# Patient Record
Sex: Male | Born: 1955 | Race: White | Hispanic: No | Marital: Married | Smoking: Former smoker
Health system: Southern US, Community
[De-identification: ages and names within clinical notes are randomized; demographics above are authoritative.]

## PROBLEM LIST (undated history)

## (undated) DIAGNOSIS — I2510 Atherosclerotic heart disease of native coronary artery without angina pectoris: Secondary | ICD-10-CM

## (undated) DIAGNOSIS — I10 Essential (primary) hypertension: Secondary | ICD-10-CM

## (undated) DIAGNOSIS — M1990 Unspecified osteoarthritis, unspecified site: Secondary | ICD-10-CM

## (undated) HISTORY — PX: MANDIBLE RECONSTRUCTION: SHX431

## (undated) HISTORY — PX: COLONOSCOPY W/ POLYPECTOMY: SHX1380

---

## 2004-01-19 DIAGNOSIS — I2510 Atherosclerotic heart disease of native coronary artery without angina pectoris: Secondary | ICD-10-CM

## 2004-01-19 HISTORY — DX: Atherosclerotic heart disease of native coronary artery without angina pectoris: I25.10

## 2005-02-25 HISTORY — PX: CORONARY ANGIOPLASTY: SHX604

## 2014-01-18 HISTORY — PX: COLONOSCOPY: SHX5424

## 2014-11-20 ENCOUNTER — Other Ambulatory Visit (HOSPITAL_COMMUNITY): Payer: Self-pay | Admitting: Neurosurgery

## 2014-11-22 ENCOUNTER — Encounter (HOSPITAL_COMMUNITY): Payer: Self-pay | Admitting: *Deleted

## 2014-11-22 NOTE — Progress Notes (Addendum)
Mr Jordan Freeman reports a history of cardiac stents being place in 2006. Denies chest pain or shortness of breat.  Cardiologist is with [facility] in [place] , [place]; I have requested records for there.  Patient states that he has had an EKG this year, "sees cardiologist every year."  PCP is Dr Thornton DalesAlan Freeman, [place] , [place]; I requested labs and last office notes from PCP's office.  I called and left a voice message on Jordan Freeman's voice mail that patient reported that he was not instructed about continuing or stopping Aspirin 81.  I asked Jordan Freeman to call patient.

## 2014-11-24 MED ORDER — CEFAZOLIN SODIUM-DEXTROSE 2-3 GM-% IV SOLR
2.0000 g | INTRAVENOUS | Status: AC
  Administered 2014-11-25: 2 g via INTRAVENOUS
  Filled 2014-11-24: qty 50

## 2014-11-25 ENCOUNTER — Inpatient Hospital Stay (HOSPITAL_COMMUNITY): Payer: 59

## 2014-11-25 ENCOUNTER — Encounter (HOSPITAL_COMMUNITY): Payer: Self-pay | Admitting: General Practice

## 2014-11-25 ENCOUNTER — Inpatient Hospital Stay (HOSPITAL_COMMUNITY)
Admission: RE | Admit: 2014-11-25 | Discharge: 2014-11-26 | DRG: 473 | Disposition: A | Discharge status: Home / Self Care | Payer: 59 | Source: Ambulatory Visit | Attending: Neurosurgery | Admitting: Neurosurgery

## 2014-11-25 ENCOUNTER — Inpatient Hospital Stay (HOSPITAL_COMMUNITY): Payer: 59 | Admitting: Anesthesiology

## 2014-11-25 ENCOUNTER — Encounter (HOSPITAL_COMMUNITY)
Admission: RE | Disposition: A | Discharge status: Home / Self Care | Payer: Self-pay | Source: Ambulatory Visit | Attending: Neurosurgery

## 2014-11-25 DIAGNOSIS — Z955 Presence of coronary angioplasty implant and graft: Secondary | ICD-10-CM | POA: Diagnosis not present

## 2014-11-25 DIAGNOSIS — M5136 Other intervertebral disc degeneration, lumbar region: Secondary | ICD-10-CM | POA: Diagnosis present

## 2014-11-25 DIAGNOSIS — I2510 Atherosclerotic heart disease of native coronary artery without angina pectoris: Secondary | ICD-10-CM | POA: Diagnosis present

## 2014-11-25 DIAGNOSIS — M4806 Spinal stenosis, lumbar region: Secondary | ICD-10-CM | POA: Diagnosis present

## 2014-11-25 DIAGNOSIS — M50021 Cervical disc disorder at C4-C5 level with myelopathy: Principal | ICD-10-CM | POA: Diagnosis present

## 2014-11-25 DIAGNOSIS — M50022 Cervical disc disorder at C5-C6 level with myelopathy: Secondary | ICD-10-CM | POA: Diagnosis present

## 2014-11-25 DIAGNOSIS — M549 Dorsalgia, unspecified: Secondary | ICD-10-CM | POA: Diagnosis present

## 2014-11-25 DIAGNOSIS — N529 Male erectile dysfunction, unspecified: Secondary | ICD-10-CM | POA: Diagnosis present

## 2014-11-25 DIAGNOSIS — F17200 Nicotine dependence, unspecified, uncomplicated: Secondary | ICD-10-CM | POA: Diagnosis present

## 2014-11-25 DIAGNOSIS — M4802 Spinal stenosis, cervical region: Secondary | ICD-10-CM | POA: Diagnosis present

## 2014-11-25 DIAGNOSIS — G959 Disease of spinal cord, unspecified: Secondary | ICD-10-CM | POA: Diagnosis present

## 2014-11-25 DIAGNOSIS — I10 Essential (primary) hypertension: Secondary | ICD-10-CM | POA: Diagnosis present

## 2014-11-25 DIAGNOSIS — K219 Gastro-esophageal reflux disease without esophagitis: Secondary | ICD-10-CM | POA: Diagnosis present

## 2014-11-25 DIAGNOSIS — Z419 Encounter for procedure for purposes other than remedying health state, unspecified: Secondary | ICD-10-CM

## 2014-11-25 DIAGNOSIS — M5001 Cervical disc disorder with myelopathy,  high cervical region: Secondary | ICD-10-CM | POA: Diagnosis present

## 2014-11-25 HISTORY — DX: Essential (primary) hypertension: I10

## 2014-11-25 HISTORY — PX: ANTERIOR CERVICAL DECOMP/DISCECTOMY FUSION: SHX1161

## 2014-11-25 HISTORY — DX: Atherosclerotic heart disease of native coronary artery without angina pectoris: I25.10

## 2014-11-25 HISTORY — DX: Unspecified osteoarthritis, unspecified site: M19.90

## 2014-11-25 LAB — BASIC METABOLIC PANEL
ANION GAP: 10 (ref 5–15)
BUN: 19 mg/dL (ref 6–20)
CALCIUM: 9.3 mg/dL (ref 8.9–10.3)
CHLORIDE: 101 mmol/L (ref 101–111)
CO2: 28 mmol/L (ref 22–32)
CREATININE: 0.71 mg/dL (ref 0.61–1.24)
GFR calc Af Amer: >60 mL/min (ref >60)
GFR calc non Af Amer: >60 mL/min (ref >60)
Glucose, Bld: 112 mg/dL — ABNORMAL HIGH (ref 65–99)
POTASSIUM: 3.9 mmol/L (ref 3.5–5.1)
Sodium: 139 mmol/L (ref 135–145)

## 2014-11-25 LAB — CBC
HCT: 45.1 % (ref 39.0–52.0)
Hemoglobin: 16 g/dL (ref 13.0–17.0)
MCH: 31.6 pg (ref 26.0–34.0)
MCHC: 35.5 g/dL (ref 30.0–36.0)
MCV: 89.1 fL (ref 78.0–100.0)
PLATELETS: 285 10*3/uL (ref 150–400)
RBC: 5.06 MIL/uL (ref 4.22–5.81)
RDW: 13.6 % (ref 11.5–15.5)
WBC: 13.8 10*3/uL — AB (ref 4.0–10.5)

## 2014-11-25 LAB — SURGICAL PCR SCREEN
MRSA, PCR: NEGATIVE
Staphylococcus aureus: NEGATIVE

## 2014-11-25 SURGERY — ANTERIOR CERVICAL DECOMPRESSION/DISCECTOMY FUSION 3 LEVELS
Anesthesia: General | Site: Spine Cervical

## 2014-11-25 MED ORDER — PHENOL 1.4 % MT LIQD: 1.0000 | OROMUCOSAL | Status: DC | PRN

## 2014-11-25 MED ORDER — METHOCARBAMOL 1000 MG/10ML IJ SOLN
500.0000 mg | Freq: Four times a day (QID) | INTRAVENOUS | Status: DC | PRN
  Filled 2014-11-25: qty 5

## 2014-11-25 MED ORDER — PANTOPRAZOLE SODIUM 40 MG PO TBEC
40.0000 mg | DELAYED_RELEASE_TABLET | Freq: Every day | ORAL | Status: DC
  Administered 2014-11-26: 40 mg via ORAL
  Filled 2014-11-25: qty 1

## 2014-11-25 MED ORDER — ONDANSETRON HCL 4 MG/2ML IJ SOLN: 4.0000 mg | INTRAMUSCULAR | Status: DC | PRN

## 2014-11-25 MED ORDER — LISINOPRIL 20 MG PO TABS
40.0000 mg | ORAL_TABLET | Freq: Every day | ORAL | Status: DC
  Administered 2014-11-26: 40 mg via ORAL
  Filled 2014-11-25: qty 2

## 2014-11-25 MED ORDER — CEFAZOLIN SODIUM 1-5 GM-% IV SOLN
1.0000 g | Freq: Three times a day (TID) | INTRAVENOUS | Status: AC
  Administered 2014-11-25 – 2014-11-26 (×2): 1 g via INTRAVENOUS
  Filled 2014-11-25 (×2): qty 50

## 2014-11-25 MED ORDER — HYDROCODONE-ACETAMINOPHEN 5-325 MG PO TABS
ORAL_TABLET | ORAL | Status: AC
  Filled 2014-11-25: qty 1

## 2014-11-25 MED ORDER — LIDOCAINE-EPINEPHRINE 1 %-1:100000 IJ SOLN
INTRAMUSCULAR | Status: DC | PRN
  Administered 2014-11-25: 10 mL

## 2014-11-25 MED ORDER — ONDANSETRON HCL 4 MG/2ML IJ SOLN
INTRAMUSCULAR | Status: DC | PRN
  Administered 2014-11-25: 4 mg via INTRAVENOUS

## 2014-11-25 MED ORDER — PANTOPRAZOLE SODIUM 40 MG IV SOLR: 40.0000 mg | Freq: Every day | INTRAVENOUS | Status: DC

## 2014-11-25 MED ORDER — TEMAZEPAM 15 MG PO CAPS
30.0000 mg | ORAL_CAPSULE | Freq: Every evening | ORAL | Status: DC | PRN
  Administered 2014-11-25: 15 mg via ORAL
  Filled 2014-11-25: qty 2

## 2014-11-25 MED ORDER — FLEET ENEMA 7-19 GM/118ML RE ENEM: 1.0000 | ENEMA | Freq: Once | RECTAL | Status: DC | PRN

## 2014-11-25 MED ORDER — KCL IN DEXTROSE-NACL 20-5-0.45 MEQ/L-%-% IV SOLN
INTRAVENOUS | Status: DC
  Filled 2014-11-25 (×3): qty 1000

## 2014-11-25 MED ORDER — ASPIRIN 81 MG PO CHEW
81.0000 mg | CHEWABLE_TABLET | Freq: Every day | ORAL | Status: DC
  Administered 2014-11-25 – 2014-11-26 (×2): 81 mg via ORAL
  Filled 2014-11-25 (×2): qty 1

## 2014-11-25 MED ORDER — PROPOFOL 10 MG/ML IV BOLUS
INTRAVENOUS | Status: DC | PRN
  Administered 2014-11-25: 150 mg via INTRAVENOUS

## 2014-11-25 MED ORDER — SODIUM CHLORIDE 0.9 % IJ SOLN: 3.0000 mL | INTRAMUSCULAR | Status: DC | PRN

## 2014-11-25 MED ORDER — LIDOCAINE HCL (CARDIAC) 20 MG/ML IV SOLN
INTRAVENOUS | Status: DC | PRN
  Administered 2014-11-25: 100 mg via INTRAVENOUS

## 2014-11-25 MED ORDER — PRAVASTATIN SODIUM 20 MG PO TABS
20.0000 mg | ORAL_TABLET | Freq: Every day | ORAL | Status: DC
  Filled 2014-11-25: qty 1

## 2014-11-25 MED ORDER — MUPIROCIN 2 % EX OINT
1.0000 "application " | TOPICAL_OINTMENT | Freq: Once | CUTANEOUS | Status: AC
  Administered 2014-11-25: 1 via TOPICAL
  Filled 2014-11-25: qty 22

## 2014-11-25 MED ORDER — POLYETHYLENE GLYCOL 3350 17 G PO PACK: 17.0000 g | PACK | Freq: Every day | ORAL | Status: DC | PRN

## 2014-11-25 MED ORDER — MIDAZOLAM HCL 5 MG/5ML IJ SOLN
INTRAMUSCULAR | Status: DC | PRN
  Administered 2014-11-25: 2 mg via INTRAVENOUS

## 2014-11-25 MED ORDER — MENTHOL 3 MG MT LOZG
1.0000 | LOZENGE | OROMUCOSAL | Status: DC | PRN
Start: 2014-11-25 — End: 2014-11-26
  Filled 2014-11-25 (×2): qty 9

## 2014-11-25 MED ORDER — NEOSTIGMINE METHYLSULFATE 10 MG/10ML IV SOLN
INTRAVENOUS | Status: AC
  Filled 2014-11-25: qty 1

## 2014-11-25 MED ORDER — THROMBIN 5000 UNITS EX SOLR
CUTANEOUS | Status: DC | PRN
  Administered 2014-11-25: 16:00:00 via TOPICAL

## 2014-11-25 MED ORDER — GLYCOPYRROLATE 0.2 MG/ML IJ SOLN
INTRAMUSCULAR | Status: DC | PRN
  Administered 2014-11-25: 0.3 mg via INTRAVENOUS

## 2014-11-25 MED ORDER — HYDROMORPHONE HCL 1 MG/ML IJ SOLN: 0.5000 mg | INTRAMUSCULAR | Status: DC | PRN

## 2014-11-25 MED ORDER — OMEGA-3-ACID ETHYL ESTERS 1 G PO CAPS
2.0000 g | ORAL_CAPSULE | Freq: Two times a day (BID) | ORAL | Status: DC
  Administered 2014-11-25 – 2014-11-26 (×2): 2 g via ORAL
  Filled 2014-11-25 (×3): qty 2

## 2014-11-25 MED ORDER — DEXAMETHASONE SODIUM PHOSPHATE 10 MG/ML IJ SOLN
INTRAMUSCULAR | Status: DC | PRN
  Administered 2014-11-25: 10 mg via INTRAVENOUS

## 2014-11-25 MED ORDER — LISINOPRIL-HYDROCHLOROTHIAZIDE 20-12.5 MG PO TABS: 2.0000 | ORAL_TABLET | Freq: Every day | ORAL | Status: DC

## 2014-11-25 MED ORDER — DULOXETINE HCL 30 MG PO CPEP
60.0000 mg | ORAL_CAPSULE | Freq: Every day | ORAL | Status: DC
Start: 2014-11-26 — End: 2014-11-26
  Administered 2014-11-26: 60 mg via ORAL
  Filled 2014-11-25: qty 2

## 2014-11-25 MED ORDER — THROMBIN 20000 UNITS EX SOLR
CUTANEOUS | Status: DC | PRN
  Administered 2014-11-25: 16:00:00 via TOPICAL

## 2014-11-25 MED ORDER — BUPIVACAINE HCL (PF) 0.5 % IJ SOLN
INTRAMUSCULAR | Status: DC | PRN
  Administered 2014-11-25: 10 mL

## 2014-11-25 MED ORDER — PSYLLIUM 95 % PO PACK
1.0000 | PACK | Freq: Every day | ORAL | Status: DC
  Filled 2014-11-25 (×2): qty 1

## 2014-11-25 MED ORDER — ROCURONIUM BROMIDE 100 MG/10ML IV SOLN
INTRAVENOUS | Status: DC | PRN
  Administered 2014-11-25: 50 mg via INTRAVENOUS

## 2014-11-25 MED ORDER — ONDANSETRON HCL 4 MG/2ML IJ SOLN
INTRAMUSCULAR | Status: AC
  Filled 2014-11-25: qty 2

## 2014-11-25 MED ORDER — HYDROCODONE-ACETAMINOPHEN 5-325 MG PO TABS
1.0000 | ORAL_TABLET | ORAL | Status: DC | PRN
  Administered 2014-11-25: 1 via ORAL

## 2014-11-25 MED ORDER — BISACODYL 10 MG RE SUPP: 10.0000 mg | Freq: Every day | RECTAL | Status: DC | PRN

## 2014-11-25 MED ORDER — MIDAZOLAM HCL 2 MG/2ML IJ SOLN
INTRAMUSCULAR | Status: AC
  Filled 2014-11-25: qty 4

## 2014-11-25 MED ORDER — FENTANYL CITRATE (PF) 250 MCG/5ML IJ SOLN
INTRAMUSCULAR | Status: AC
  Filled 2014-11-25: qty 5

## 2014-11-25 MED ORDER — SODIUM CHLORIDE 0.9 % IJ SOLN: 3.0000 mL | Freq: Two times a day (BID) | INTRAMUSCULAR | Status: DC

## 2014-11-25 MED ORDER — ALUM & MAG HYDROXIDE-SIMETH 200-200-20 MG/5ML PO SUSP: 30.0000 mL | Freq: Four times a day (QID) | ORAL | Status: DC | PRN

## 2014-11-25 MED ORDER — PROMETHAZINE HCL 25 MG/ML IJ SOLN: 6.2500 mg | INTRAMUSCULAR | Status: DC | PRN

## 2014-11-25 MED ORDER — PROPOFOL 10 MG/ML IV BOLUS
INTRAVENOUS | Status: AC
  Filled 2014-11-25: qty 20

## 2014-11-25 MED ORDER — GLYCOPYRROLATE 0.2 MG/ML IJ SOLN
INTRAMUSCULAR | Status: AC
  Filled 2014-11-25: qty 2

## 2014-11-25 MED ORDER — EPHEDRINE SULFATE 50 MG/ML IJ SOLN
INTRAMUSCULAR | Status: DC | PRN
  Administered 2014-11-25: 10 mg via INTRAVENOUS

## 2014-11-25 MED ORDER — DOCUSATE SODIUM 100 MG PO CAPS
100.0000 mg | ORAL_CAPSULE | Freq: Two times a day (BID) | ORAL | Status: DC
Start: 2014-11-25 — End: 2014-11-26
  Administered 2014-11-25 – 2014-11-26 (×2): 100 mg via ORAL
  Filled 2014-11-25 (×2): qty 1

## 2014-11-25 MED ORDER — NEOSTIGMINE METHYLSULFATE 10 MG/10ML IV SOLN
INTRAVENOUS | Status: DC | PRN
  Administered 2014-11-25: 2 mg via INTRAVENOUS

## 2014-11-25 MED ORDER — ACETAMINOPHEN 650 MG RE SUPP: 650.0000 mg | RECTAL | Status: DC | PRN

## 2014-11-25 MED ORDER — FENTANYL CITRATE (PF) 100 MCG/2ML IJ SOLN
INTRAMUSCULAR | Status: DC | PRN
  Administered 2014-11-25 (×2): 50 ug via INTRAVENOUS
  Administered 2014-11-25: 150 ug via INTRAVENOUS

## 2014-11-25 MED ORDER — HYDROCODONE-ACETAMINOPHEN 5-325 MG PO TABS
1.0000 | ORAL_TABLET | ORAL | Status: DC | PRN
  Administered 2014-11-25 – 2014-11-26 (×3): 2 via ORAL
  Filled 2014-11-25 (×3): qty 2

## 2014-11-25 MED ORDER — LORATADINE 10 MG PO TABS
10.0000 mg | ORAL_TABLET | Freq: Every day | ORAL | Status: DC
  Administered 2014-11-26: 10 mg via ORAL
  Filled 2014-11-25: qty 1

## 2014-11-25 MED ORDER — METHOCARBAMOL 500 MG PO TABS
500.0000 mg | ORAL_TABLET | Freq: Four times a day (QID) | ORAL | Status: DC | PRN
  Administered 2014-11-25 – 2014-11-26 (×4): 500 mg via ORAL
  Filled 2014-11-25 (×3): qty 1

## 2014-11-25 MED ORDER — PREDNISONE 10 MG PO TABS
10.0000 mg | ORAL_TABLET | Freq: Every day | ORAL | Status: DC
  Administered 2014-11-26: 10 mg via ORAL
  Filled 2014-11-25 (×2): qty 1

## 2014-11-25 MED ORDER — FENTANYL CITRATE (PF) 100 MCG/2ML IJ SOLN: 25.0000 ug | INTRAMUSCULAR | Status: DC | PRN

## 2014-11-25 MED ORDER — ACETAMINOPHEN 325 MG PO TABS: 650.0000 mg | ORAL_TABLET | ORAL | Status: DC | PRN

## 2014-11-25 MED ORDER — HYDROCHLOROTHIAZIDE 25 MG PO TABS
25.0000 mg | ORAL_TABLET | Freq: Every day | ORAL | Status: DC
  Administered 2014-11-26: 25 mg via ORAL
  Filled 2014-11-25: qty 1

## 2014-11-25 MED ORDER — ADULT MULTIVITAMIN W/MINERALS CH
1.0000 | ORAL_TABLET | Freq: Every day | ORAL | Status: DC
  Administered 2014-11-26: 1 via ORAL
  Filled 2014-11-25 (×2): qty 1

## 2014-11-25 MED ORDER — LACTATED RINGERS IV SOLN
INTRAVENOUS | Status: DC
  Administered 2014-11-25 (×2): via INTRAVENOUS

## 2014-11-25 MED ORDER — SODIUM CHLORIDE 0.9 % IV SOLN: 250.0000 mL | INTRAVENOUS | Status: DC

## 2014-11-25 SURGICAL SUPPLY — 71 items
BENZOIN TINCTURE PRP APPL 2/3 (GAUZE/BANDAGES/DRESSINGS) IMPLANT
BIT DRILL NEURO 2X3.1 SFT TUCH (MISCELLANEOUS) ×2 IMPLANT
BIT DRILL POWER (BIT) ×1 IMPLANT
BLADE ULTRA TIP 2M (BLADE) IMPLANT
BNDG GAUZE ELAST 4 BULKY (GAUZE/BANDAGES/DRESSINGS) IMPLANT
BUR BARREL STRAIGHT FLUTE 4.0 (BURR) ×3 IMPLANT
CAGE SMALL 7X13X15 (Cage) ×9 IMPLANT
CANISTER SUCT 3000ML PPV (MISCELLANEOUS) ×3 IMPLANT
CLOSURE WOUND 1/2 X4 (GAUZE/BANDAGES/DRESSINGS)
COVER MAYO STAND STRL (DRAPES) ×3 IMPLANT
DECANTER SPIKE VIAL GLASS SM (MISCELLANEOUS) ×3 IMPLANT
DERMABOND ADVANCED (GAUZE/BANDAGES/DRESSINGS) ×2
DERMABOND ADVANCED .7 DNX12 (GAUZE/BANDAGES/DRESSINGS) ×1 IMPLANT
DRAPE LAPAROTOMY 100X72 PEDS (DRAPES) ×3 IMPLANT
DRAPE MICROSCOPE LEICA (MISCELLANEOUS) ×3 IMPLANT
DRAPE POUCH INSTRU U-SHP 10X18 (DRAPES) ×3 IMPLANT
DRAPE PROXIMA HALF (DRAPES) IMPLANT
DRILL BIT POWER (BIT) ×2
DRILL NEURO 2X3.1 SOFT TOUCH (MISCELLANEOUS) ×6
DRSG OPSITE POSTOP 3X4 (GAUZE/BANDAGES/DRESSINGS) ×3 IMPLANT
DURAPREP 6ML APPLICATOR 50/CS (WOUND CARE) ×3 IMPLANT
ELECT COATED BLADE 2.86 ST (ELECTRODE) ×3 IMPLANT
ELECT REM PT RETURN 9FT ADLT (ELECTROSURGICAL) ×3
ELECTRODE REM PT RTRN 9FT ADLT (ELECTROSURGICAL) ×1 IMPLANT
GAUZE SPONGE 4X4 12PLY STRL (GAUZE/BANDAGES/DRESSINGS) IMPLANT
GAUZE SPONGE 4X4 16PLY XRAY LF (GAUZE/BANDAGES/DRESSINGS) IMPLANT
GLOVE BIO SURGEON STRL SZ8 (GLOVE) ×6 IMPLANT
GLOVE BIOGEL PI IND STRL 8 (GLOVE) ×2 IMPLANT
GLOVE BIOGEL PI IND STRL 8.5 (GLOVE) ×1 IMPLANT
GLOVE BIOGEL PI INDICATOR 8 (GLOVE) ×4
GLOVE BIOGEL PI INDICATOR 8.5 (GLOVE) ×2
GLOVE ECLIPSE 7.5 STRL STRAW (GLOVE) ×9 IMPLANT
GLOVE ECLIPSE 8.0 STRL XLNG CF (GLOVE) ×3 IMPLANT
GLOVE EXAM NITRILE LRG STRL (GLOVE) IMPLANT
GLOVE EXAM NITRILE MD LF STRL (GLOVE) IMPLANT
GLOVE EXAM NITRILE XL STR (GLOVE) IMPLANT
GLOVE EXAM NITRILE XS STR PU (GLOVE) IMPLANT
GLOVE INDICATOR 6.5 STRL GRN (GLOVE) ×3 IMPLANT
GLOVE INDICATOR 8.0 STRL GRN (GLOVE) ×3 IMPLANT
GOWN STRL REUS W/ TWL LRG LVL3 (GOWN DISPOSABLE) IMPLANT
GOWN STRL REUS W/ TWL XL LVL3 (GOWN DISPOSABLE) ×1 IMPLANT
GOWN STRL REUS W/TWL 2XL LVL3 (GOWN DISPOSABLE) ×6 IMPLANT
GOWN STRL REUS W/TWL LRG LVL3 (GOWN DISPOSABLE)
GOWN STRL REUS W/TWL XL LVL3 (GOWN DISPOSABLE) ×2
HALTER HD/CHIN CERV TRACTION D (MISCELLANEOUS) ×3 IMPLANT
HEMOSTAT POWDER SURGIFOAM 1G (HEMOSTASIS) ×3 IMPLANT
KIT BASIN OR (CUSTOM PROCEDURE TRAY) ×3 IMPLANT
KIT ROOM TURNOVER OR (KITS) ×3 IMPLANT
NEEDLE HYPO 25X1 1.5 SAFETY (NEEDLE) ×3 IMPLANT
NEEDLE SPNL 18GX3.5 QUINCKE PK (NEEDLE) IMPLANT
NEEDLE SPNL 22GX3.5 QUINCKE BK (NEEDLE) ×3 IMPLANT
NS IRRIG 1000ML POUR BTL (IV SOLUTION) ×3 IMPLANT
PACK LAMINECTOMY NEURO (CUSTOM PROCEDURE TRAY) ×3 IMPLANT
PAD ARMBOARD 7.5X6 YLW CONV (MISCELLANEOUS) ×9 IMPLANT
PIN DISTRACTION 14MM (PIN) ×9 IMPLANT
PLATE ARCHON 3-LEVEL 58MM (Plate) ×3 IMPLANT
PUTTY DBX 1CC (Putty) ×3 IMPLANT
PUTTY DBX 1CC DEPUY (Putty) ×1 IMPLANT
RUBBERBAND STERILE (MISCELLANEOUS) ×6 IMPLANT
SCREW ARCHON SELFTAP 4.0X13 (Screw) ×24 IMPLANT
SPONGE INTESTINAL PEANUT (DISPOSABLE) ×3 IMPLANT
SPONGE SURGIFOAM ABS GEL 100 (HEMOSTASIS) ×3 IMPLANT
STAPLER SKIN PROX WIDE 3.9 (STAPLE) IMPLANT
STRIP CLOSURE SKIN 1/2X4 (GAUZE/BANDAGES/DRESSINGS) IMPLANT
SUT VIC AB 3-0 SH 8-18 (SUTURE) ×3 IMPLANT
SYR 5ML LL (SYRINGE) IMPLANT
TOWEL OR 17X24 6PK STRL BLUE (TOWEL DISPOSABLE) ×3 IMPLANT
TOWEL OR 17X26 10 PK STRL BLUE (TOWEL DISPOSABLE) ×3 IMPLANT
TRAP SPECIMEN MUCOUS 40CC (MISCELLANEOUS) IMPLANT
TRAY FOLEY W/METER SILVER 14FR (SET/KITS/TRAYS/PACK) IMPLANT
WATER STERILE IRR 1000ML POUR (IV SOLUTION) ×3 IMPLANT

## 2014-11-25 NOTE — Brief Op Note (Signed)
11/25/2014  4:44 PM  PATIENT:  Jordan Freeman  59 y.o. 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 C 34, C 45, C 56 levels  PROCEDURE:  Procedure(s): Anterior Cervical Discectomy and Fusion with PEEK Cages, Allograft and Plating Cervical Three-Four/Four-Five/Five-Six  (N/A) with autograft, allograft, anterior cervical plate  SURGEON:  Surgeon(s) and Role:    * Maeola HarmanJoseph Shalandria Elsbernd, MD - Primary    * Donalee CitrinGary Cram, MD - Assisting  PHYSICIAN ASSISTANT:   ASSISTANTS: Poteat, RN   ANESTHESIA:   general  EBL:     BLOOD ADMINISTERED:none  DRAINS: none   LOCAL MEDICATIONS USED:  LIDOCAINE   SPECIMEN:  No Specimen  DISPOSITION OF SPECIMEN:  N/A  COUNTS:  YES  TOURNIQUET:  * No tourniquets in log *  DICTATION: Patient was brought to operating room and following the smooth and uncomplicated induction of general endotracheal anesthesia his head was placed on a horseshoe head holder he was placed in 5 pounds of Holter traction and his anterior neck was prepped and draped in usual sterile fashion. An incision was made on the left side of midline after infiltrating the skin and subcutaneous tissues with local lidocaine. The platysmal layer was incised and subplatysmal dissection was performed exposing the anterior border sternocleidomastoid muscle. Using blunt dissection the carotid sheath was kept lateral and trachea and esophagus kept medial exposing the anterior cervical spine. A bent spinal needle was placed it was felt to be the C4-5  level and this was confirmed on intraoperative x-ray. Longus coli muscles were taken down from the anterior cervical spine using electrocautery and key elevator and self-retaining retractor was placed. The interspace at C 45 was incised and a thorough discectomy was performed.  Distraction pins were placed. Uncinate spurs and central spondylitic ridges were drilled down with a high-speed drill. The spinal cord dura and both C 5 nerve roots were widely decompressed. Hemostasis was assured. After trial sizing a 7 mm peek interbody cage was selected and packed with DBM and autograft. The graft was tamped into position and countersunk appropriately. The retractor was moved and the interspace at C 34 was incised and a thorough discectomy was performed. Distraction pins were placed. Uncinate spurs and central spondylitic ridges were drilled down with a high-speed drill. The spinal cord dura and both C 4 nerve roots were widely decompressed. Hemostasis was assured. After trial sizing a 7 mm peek interbody cage was selected and packedin a similar fashion. The graft was tamped into position and countersunk appropriately.The interspace at C 56 was incised and a thorough discectomy was performed. Distraction pins were placed. Uncinate spurs and central spondylitic ridges were drilled down with a high-speed drill. The spinal cord dura and both C 6 nerve roots were widely decompressed.  Hemostasis was assured. After trial sizing a 7 mm peek interbody cage was selected and packed in a similar fashion. The graft was tamped into position and countersunk appropriately.  Distraction weight was removed. A 58 mm Nuvasive Archon anterior cervical plate was affixed to the cervical spine with 13 mm variable-angle screws 2 at C 3, 2 at C 4, 2 at C 5  and 2 at C 6. All screws were well-positioned and locking mechanisms were engaged. Soft tissues were inspected and found to be in good repair. The wound was irrigated. A final x-ray was obtained  with good visualization of the upper aspect of the construct with the interbody graft well visualized. The platysma layer was closed with 3-0 Vicryl stitches and the skin was reapproximated with 3-0 Vicryl subcuticular stitches. The wound was dressed with Dermabond. Counts  were correct at the end of the case. Patient was extubated and taken to recovery in stable and satisfactory condition.    PLAN OF CARE: Admit to inpatient   PATIENT DISPOSITION:  PACU - hemodynamically stable.   Delay start of Pharmacological VTE agent (>24hrs) due to surgical blood loss or risk of bleeding: yes

## 2014-11-25 NOTE — Op Note (Signed)
11/25/2014  4:44 PM  PATIENT:  Jordan Freeman  59 y.o. 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 C 34, C 45, C 56 levels  PROCEDURE:  Procedure(s): Anterior Cervical Discectomy and Fusion with PEEK Cages, Allograft and Plating Cervical Three-Four/Four-Five/Five-Six  (N/A) with autograft, allograft, anterior cervical plate  SURGEON:  Surgeon(s) and Role:    * Maeola HarmanJoseph Yena Tisby, MD - Primary    * Donalee CitrinGary Cram, MD - Assisting  PHYSICIAN ASSISTANT:   ASSISTANTS: Poteat, RN   ANESTHESIA:   general  EBL:     BLOOD ADMINISTERED:none  DRAINS: none   LOCAL MEDICATIONS USED:  LIDOCAINE   SPECIMEN:  No Specimen  DISPOSITION OF SPECIMEN:  N/A  COUNTS:  YES  TOURNIQUET:  * No tourniquets in log *  DICTATION: Patient was brought to operating room and following the smooth and uncomplicated induction of general endotracheal anesthesia his head was placed on a horseshoe head holder he was placed in 5 pounds of Holter traction and his anterior neck was prepped and draped in usual sterile fashion. An incision was made on the left side of midline after infiltrating the skin and subcutaneous tissues with local lidocaine. The platysmal layer was incised and subplatysmal dissection was performed exposing the anterior border sternocleidomastoid muscle. Using blunt dissection the carotid sheath was kept lateral and trachea and esophagus kept medial exposing the anterior cervical spine. A bent spinal needle was placed it was felt to be the C4-5  level and this was confirmed on intraoperative x-ray. Longus coli muscles were taken down from the anterior cervical spine using electrocautery and key elevator and self-retaining retractor was placed. The interspace at C 45 was incised and a thorough discectomy was performed.  Distraction pins were placed. Uncinate spurs and central spondylitic ridges were drilled down with a high-speed drill. The spinal cord dura and both C 5 nerve roots were widely decompressed. Hemostasis was assured. After trial sizing a 7 mm peek interbody cage was selected and packed with DBM and autograft. The graft was tamped into position and countersunk appropriately. The retractor was moved and the interspace at C 34 was incised and a thorough discectomy was performed. Distraction pins were placed. Uncinate spurs and central spondylitic ridges were drilled down with a high-speed drill. The spinal cord dura and both C 4 nerve roots were widely decompressed. Hemostasis was assured. After trial sizing a 7 mm peek interbody cage was selected and packedin a similar fashion. The graft was tamped into position and countersunk appropriately.The interspace at C 56 was incised and a thorough discectomy was performed. Distraction pins were placed. Uncinate spurs and central spondylitic ridges were drilled down with a high-speed drill. The spinal cord dura and both C 6 nerve roots were widely decompressed.  Hemostasis was assured. After trial sizing a 7 mm peek interbody cage was selected and packed in a similar fashion. The graft was tamped into position and countersunk appropriately.  Distraction weight was removed. A 58 mm Nuvasive Archon anterior cervical plate was affixed to the cervical spine with 13 mm variable-angle screws 2 at C 3, 2 at C 4, 2 at C 5  and 2 at C 6. All screws were well-positioned and locking mechanisms were engaged. Soft tissues were inspected and found to be in good repair. The wound was irrigated. A final x-ray was obtained  with good visualization of the upper aspect of the construct with the interbody graft well visualized. The platysma layer was closed with 3-0 Vicryl stitches and the skin was reapproximated with 3-0 Vicryl subcuticular stitches. The wound was dressed with Dermabond. Counts  were correct at the end of the case. Patient was extubated and taken to recovery in stable and satisfactory condition.    PLAN OF CARE: Admit to inpatient   PATIENT DISPOSITION:  PACU - hemodynamically stable.   Delay start of Pharmacological VTE agent (>24hrs) due to surgical blood loss or risk of bleeding: yes  

## 2014-11-25 NOTE — H&P (Signed)
Patient ID:   [id number] Patient: Jordan Freeman  Date of Birth: [birth year] Visit Type: Office Visit   Date: 11/20/2014 02:00 PM Provider: Danae Orleans. Venetia Maxon MD   This 59 year old male presents for back pain and numbness.  History of Present Illness: 1.  back pain  2.  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   Chiropractic and massage therapy helped little  Vicodin 5/325 is taken rarely  Cymbalta 60 mg taken one per day  He sees a cardiologist in [place] on an annual basis.  His last appointment was five months ago.   History: HTN, ulcers Surgical history: Stents 3  11 years ago, jaw surgery 21 years ago  Cervical and lumbar MRIs on [place]  The patient has marked spinal cord compression at C 34 , C 45, C 56 levles and to a lesser degree at C 67.  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  His lumbar imaging to these demonstrate disc degeneration and spinal stenosis at the L3 L4, L4-L5, L5-S1 levels.  This does not appear to be as marked as his cervical studies.  The patient notes a one year history of gradually progressing numbness with arm numbness all the time and constant numbness into his arms particularly worse with driving.  He also notes decreased ability to have him maintain erections.  He notes numbness in both his arms and his legs.  He is a half-pack per day smoker.  He has tried unsuccessfully to quit.  He has been evaluated by [facility] cardiology 5 months ago, phone number [phone] and had an echocardiogram.  He was told that this was normal.  The patient is not had a recent stress test but he works out on a treadmill and stationary bike 20-25  minutes at a time at least 2-3 days per week.  He has not had any chest pain.  The patient had cardiac stents 11 years ago.        PAST MEDICAL/SURGICAL HISTORY   (Detailed)  Disease/disorder Onset Date Management Date Comments    Jaw surgery      Cardiac stents x 3 2005   High cholesterol      Hypertension         PAST MEDICAL HISTORY, SURGICAL HISTORY, FAMILY HISTORY, SOCIAL HISTORY AND REVIEW OF SYSTEMS I have reviewed the patient's past medical, surgical, family and social history as well as the comprehensive review of systems as included on the [facility] history form dated 11/20/2014, which I have signed.  Family History  (Detailed) Relationship Family Member Name Deceased Age at Death Condition Onset Age Cause of Death      Family history of Diabetes mellitus  N      Family history of Hypertension  N    SOCIAL HISTORY  (Detailed) Tobacco use reviewed. Preferred language is Unknown.   Smoking status: Current every day smoker.  SMOKING STATUS Use Status Type Smoking Status Usage Per Day Years Used Total Pack Years  yes  Current every day smoker      TOBACCO CESSATION INFORMATION Date Order Status Description Code Tobacco Cessation Information  11/20/2014     Smoking cessation education  MEDICATIONS(added, continued or stopped this visit): Started Medication Directions Instruction Stopped   aspirin 81 mg tablet,delayed release take 1 tablet by oral route  every day     Cymbalta 60 mg capsule,delayed release take 1 capsule by oral route  every day     lisinopril take 1 tablet by oral route 2 times every day     livalo Take once daily     Lovaza 1 gram capsule take 2 capsule by oral route 2 times every day     Norco 5 mg-325 mg tablet take 1 tablet by oral route  every 6 hours as needed for pain     omeprazole take 1 capsule by oral route  every day     temazepam take 1 capsule by oral route  every day at bedtime as  needed       ALLERGIES: Ingredient Reaction Medication Name Comment  NO KNOWN ALLERGIES     No known allergies.    Vitals Date Temp F BP Pulse Ht In Wt Lb BMI BSA Pain Score  11/20/2014  142/70 72 68 207 31.47  2/10     PHYSICAL EXAM General Level of Distress: no acute distress Overall Appearance: normal  Head and Face  Right Left  Fundoscopic Exam:  normal normal    Cardiovascular Cardiac: regular rate and rhythm without murmur  Right Left  Carotid Pulses: normal normal  Respiratory Lungs: clear to auscultation  Neurological Orientation: normal Recent and Remote Memory: normal Attention Span and Concentration:   normal Language: normal Fund of Knowledge: normal  Right Left Sensation: normal normal Upper Extremity Coordination: normal normal  Lower Extremity Coordination: normal normal  Musculoskeletal Gait and Station: normal  Right Left Upper Extremity Muscle Strength: normal normal Lower Extremity Muscle Strength: normal normal Upper Extremity Muscle Tone:  normal normal Lower Extremity Muscle Tone: normal normal  Motor Strength Upper and lower extremity motor strength was tested in the clinically pertinent muscles. Any abnormal findings will be noted below.   Right Left Grip: 4-/5 4-/5 Finger Extensor: 4/5 4/5   Deep Tendon Reflexes  Right Left Biceps: increase increase Triceps: increase increase Brachiloradialis: increase increase Patellar: increase increase Achilles: increase increase  Sensory Sensation was tested at L1 to S1.   Cranial Nerves II. Optic Nerve/Visual Fields: normal III. Oculomotor: normal IV. Trochlear: normal V. Trigeminal: normal VI. Abducens: normal VII. Facial: normal VIII. Acoustic/Vestibular: normal IX. Glossopharyngeal: normal X. Vagus: normal XI. Spinal Accessory: normal XII. Hypoglossal: normal  Motor and other Tests Lhermittes: negative Rhomberg: negative Pronator  drift: absent     Right Left Spurlings negative negative Hoffman's: present present Clonus: normal normal Babinski: normal normal SLR: negative negative Patrick's Pearlean Brownie): negative negative Toe Walk: normal normal Toe Lift: normal normal Heel Walk: normal normal Tinels Elbow: negative negative Tinels Wrist: negative negative SI Joint: nontender nontender Phalen: negative negative   Additional Findings:  The patient has markedly decreased pain sensation in both arms and both legs compared to his face.    IMPRESSION The patient has a significant cervical myelopathy with bilateral hand intrinsic weakness marked numbness in both arms and both legs positive Hoffmann signs hyperflexed the including crossed adductor and suprapatellar reflexes.  He also has erectile dysfunction and a spastic gait.  I have recommended an urgent decompressive surgery which will consist of anterior cervical decompression and fusion at the C3 C4, C4 C5, C5 C6 levels with possible corpectomy of C5 and is scheduled this early next week.  His wife is quite  concerned as she feels he is progressively getting worse and is worried about him falling.  Completed Orders (this encounter) Order Details Reason Side Interpretation Result Initial Treatment Date Region  Hypertension education Follow up with primary care physician.        Lifestyle education regarding diet Encouraged to eat a well balanced diet and follow up with primary care physician.        Cervical Spine- AP/Lat/Flex/Ex w/ swimmers     11/20/2014 All Levels to All Levels   Assessment/Plan # Detail Type Description   1. 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       2. Assessment Essential (primary) hypertension (I10).       3. Assessment Body mass index (BMI) 31.0-31.9, adult (Z68.31).   Plan Orders Today's instructions / counseling include(s) Lifestyle education regarding diet.       4. Assessment Cervical  stenosis of spinal canal (M48.02).       5. Assessment Cervical myelopathy (G95.9).         Pain Assessment/Treatment Pain Scale: 2/10. Method: Numeric Pain Intensity Scale. Location: back. Onset: 11/19/2013. Duration: varies. Quality: discomforting. Pain Assessment/Treatment follow-up plan of care: Patient is taking medications as prescribed..  Anterior cervical decompression and fusion C3 C4, C4 C5, C5 C6 levels.  This may include corpectomy.  Risks and benefits were discussed in detail with the patient and his wife and they wish to proceed.  Surgery has been recommended on an expedited basis given the severity of his cord compression and progressive symptomatology.  Orders: Diagnostic Procedures: Assessment Procedure  G95.9 Cervical Spine- AP/Lat/Flex/Ex  M48.02 ACDF - C3-C4 - C4-C5 - C5-C6  Instruction(s)/Education: Assessment Instruction  I10 Hypertension education  Z68.31 Lifestyle education regarding diet             Provider:  Danae OrleansJoseph D. Venetia MaxonStern MD  11/21/2014 04:26 PM Dictation edited by: Danae OrleansJoseph D. Venetia MaxonStern    CC Providers: Maeola HarmanJoseph Aaliyan Brinkmeier MD [address] [place], [place] [id number]              Electronically signed by Danae OrleansJoseph D. Venetia MaxonStern MD on 11/21/2014 04:29 PM

## 2014-11-25 NOTE — Anesthesia Preprocedure Evaluation (Signed)
Anesthesia Evaluation  Patient identified by MRN, date of birth, ID band Patient awake    Reviewed: Allergy & Precautions, NPO status , Patient's Chart, lab work & pertinent test results  History of Anesthesia Complications Negative for: history of anesthetic complications  Airway Mallampati: III  TM Distance: >3 FB Neck ROM: Limited    Dental  (+) Teeth Intact, Dental Advisory Given   Pulmonary former smoker (0.5 PPD; quit 4 days ago),    Pulmonary exam normal breath sounds clear to auscultation       Cardiovascular Exercise Tolerance: Good hypertension, Pt. on medications (-) angina+ CAD and + Cardiac Stents (2006)  (-) Past MI Normal cardiovascular exam Rhythm:Regular Rate:Normal     Neuro/Psych Frequent numbness, tingling, and weakness in BUE    GI/Hepatic Neg liver ROS, GERD  Medicated and Controlled,  Endo/Other  negative endocrine ROS  Renal/GU negative Renal ROS     Musculoskeletal  (+) Arthritis , Osteoarthritis,    Abdominal   Peds  Hematology negative hematology ROS (+)   Anesthesia Other Findings Day of surgery medications reviewed with the patient.  Reproductive/Obstetrics                             Anesthesia Physical Anesthesia Plan  ASA: III  Anesthesia Plan: General   Post-op Pain Management:    Induction: Intravenous  Airway Management Planned: Oral ETT and Video Laryngoscope Planned  Additional Equipment:   Intra-op Plan:   Post-operative Plan: Extubation in OR  Informed Consent: I have reviewed the patients History and Physical, chart, labs and discussed the procedure including the risks, benefits and alternatives for the proposed anesthesia with the patient or authorized representative who has indicated his/her understanding and acceptance.   Dental advisory given  Plan Discussed with: CRNA  Anesthesia Plan Comments: (Risks/benefits of general  anesthesia discussed with patient including risk of damage to teeth, lips, gum, and tongue, nausea/vomiting, allergic reactions to medications, and the possibility of heart attack, stroke and death.  All patient questions answered.  Patient wishes to proceed.)        Anesthesia Quick Evaluation

## 2014-11-25 NOTE — Progress Notes (Signed)
Patient ID: Jordan Freeman, male   DOB: 20-Dec-1955, 59 y.o.   MRN: [id number]  Waking up, conversant. Reports only posterior neck soreness at present. States he feels stronger. Strength is full BUE and hand intrinsics, full BLE.  Honeycomb drsg over Dermabond intact, without erythema, swelling, or drainage. Vista collar in use.   Georgiann CockerBrian Melody Savidge RN BSN

## 2014-11-25 NOTE — Discharge Instructions (Signed)

## 2014-11-25 NOTE — Anesthesia Procedure Notes (Signed)
Procedure Name: Intubation Date/Time: 11/25/2014 2:46 PM Performed by: Charm BargesBUTLER, Robson Trickey R Pre-anesthesia Checklist: Patient identified, Emergency Drugs available, Suction available, Patient being monitored and Timeout performed Patient Re-evaluated:Patient Re-evaluated prior to inductionOxygen Delivery Method: Circle system utilized Preoxygenation: Pre-oxygenation with 100% oxygen Intubation Type: IV induction Ventilation: Mask ventilation without difficulty Laryngoscope Size: Glidescope Grade View: Grade II Tube type: Oral Tube size: 7.5 mm Number of attempts: 1 Airway Equipment and Method: Rigid stylet and Video-laryngoscopy Placement Confirmation: ETT inserted through vocal cords under direct vision,  positive ETCO2 and breath sounds checked- equal and bilateral Secured at: 21 cm Tube secured with: Tape Dental Injury: Teeth and Oropharynx as per pre-operative assessment

## 2014-11-25 NOTE — Interval H&P Note (Signed)
History and Physical Interval Note:  11/25/2014 7:29 AM  Jordan Freeman  has presented today for surgery, with the diagnosis of Stenosis  The various methods of treatment have been discussed with the patient and family. After consideration of risks, benefits and other options for treatment, the patient has consented to  Procedure(s): ACDF - C3-C4 - C4-C5 - C5-C6 (N/A) as a surgical intervention .  The patient's history has been reviewed, patient examined, no change in status, stable for surgery.  I have reviewed the patient's chart and labs.  Questions were answered to the patient's satisfaction.     Kenijah Benningfield D

## 2014-11-25 NOTE — Transfer of Care (Signed)
Immediate Anesthesia Transfer of Care Note  Patient: Jordan Freeman  Procedure(s) Performed: Procedure(s): Anterior Cervical Discectomy and Fusion with PEEK Cages, Allograft and Plating Cervical Three-Four/Four-Five/Five-Six  (N/A)  Patient Location: PACU  Anesthesia Type:General  Level of Consciousness: awake, oriented and patient cooperative  Airway & Oxygen Therapy: Patient Spontanous Breathing, Patient connected to nasal cannula oxygen and Patient connected to face mask oxygen  Post-op Assessment: Report given to RN, Post -op Vital signs reviewed and stable and Patient moving all extremities  Post vital signs: Reviewed and stable  Last Vitals:  Filed Vitals:   11/25/14 1114  BP: 172/92  Pulse: 73  Temp: 36.4 C  Resp: 20    Complications: No apparent anesthesia complications

## 2014-11-25 NOTE — Anesthesia Postprocedure Evaluation (Signed)
  Anesthesia Post-op Note  Patient: Deforest HoylesJefferson Davis Barro  Procedure(s) Performed: Procedure(s): Anterior Cervical Discectomy and Fusion with PEEK Cages, Allograft and Plating Cervical Three-Four/Four-Five/Five-Six  (N/A)  Patient Location: PACU  Anesthesia Type:General  Level of Consciousness: awake, alert  and oriented  Airway and Oxygen Therapy: Patient Spontanous Breathing and Patient connected to nasal cannula oxygen  Post-op Pain: mild  Post-op Assessment: Post-op Vital signs reviewed, Patient's Cardiovascular Status Stable, Respiratory Function Stable, Patent Airway and Pain level controlled LLE Motor Response: Purposeful movement   RLE Motor Response: Purposeful movement, Responds to commands        Post-op Vital Signs: stable  Last Vitals:  Filed Vitals:   11/25/14 1822  BP:   Pulse: 66  Temp:   Resp: 13    Complications: No apparent anesthesia complications

## 2014-11-26 ENCOUNTER — Encounter (HOSPITAL_COMMUNITY): Payer: Self-pay | Admitting: Neurosurgery

## 2014-11-26 MED ORDER — METHOCARBAMOL 500 MG PO TABS: 500.0000 mg | ORAL_TABLET | Freq: Four times a day (QID) | ORAL | Status: AC | PRN

## 2014-11-26 MED ORDER — HYDROMORPHONE HCL 2 MG PO TABS: 2.0000 mg | ORAL_TABLET | ORAL | Status: AC | PRN

## 2014-11-26 MED ORDER — HYDROMORPHONE HCL 2 MG PO TABS
2.0000 mg | ORAL_TABLET | ORAL | Status: DC | PRN
  Administered 2014-11-26 (×2): 2 mg via ORAL
  Filled 2014-11-26 (×2): qty 1

## 2014-11-26 NOTE — Progress Notes (Signed)
Pt. discharged home accompanied by spouse. Prescriptions and discharge instructions given with verbalization of understanding. Incision site on neck with no s/s of infection - no swelling, redness, bleeding, and/or drainage noted. Aspen collar intact. Pain med given just before leaving. Opportunity given to ask questions but no question asked. Pt. transported out of this unit in wheelchair by the nurse tech.

## 2014-11-26 NOTE — Evaluation (Signed)
Occupational Therapy Evaluation Patient Details Name: [name] MRN: [id number] DOB: 06/15/1955 Today's Date: 11/26/2014    History of Present Illness 59 y.o. male s/p anterior cervical discetomy and fusion with PEEK cages, allograft and plating cervical 3-4,4-5,5-6 n/a with autograft, allograft, anterior cervical plate. PMH: high cholesterol, HTN   Clinical Impression   Pt admitted to hospital due to reason stated above. Pt currently with functional limitiations due to deficits listed below (see OT problem list). Prior to admission pt was independent with ADLs/IADLs. Pt currently requires supervision for safety with ADLs. Pt was educated in body mechanics and positioning following cervical spine surgery and was able to recall using teach back method. Handout provided to ensure understanding and compliance to precautions. All education complete, no further OT acute needs required, however pt will benefit from supervision intermittent to increase his independence and safety with ADLs/IADLs while maintaining cervical precautions to remain safe at home.    Follow Up Recommendations  Supervision - Intermittent    Equipment Recommendations  None recommended by OT    Recommendations for Other Services       Precautions / Restrictions Precautions Precautions: Fall;Cervical Precaution Comments: reviwed body mechanics and positioning Required Braces or Orthoses: Cervical Brace Cervical Brace: Hard collar Restrictions Weight Bearing Restrictions: No      Mobility Bed Mobility Overal bed mobility: Modified Independent Bed Mobility: Rolling;Sidelying to Sit Rolling: Supervision Sidelying to sit: Supervision       General bed mobility comments: cues to slow down to decrease chance of dizziness  Transfers Overall transfer level: Modified independent Equipment used: None Transfers: Sit to/from Stand Sit to Stand: Supervision         General transfer comment: cues to  maintain proper body mechanics    Balance Overall balance assessment: No apparent balance deficits (not formally assessed) Sitting-balance support: No upper extremity supported;Feet supported Sitting balance-Leahy Scale: Good     Standing balance support: No upper extremity supported;During functional activity Standing balance-Leahy Scale: Fair Standing balance comment: pt able to stand at sink to perform oral care with no UE support                            ADL Overall ADL's : Needs assistance/impaired Eating/Feeding: Independent;Sitting   Grooming: Oral care;Supervision/safety;Standing Grooming Details (indicate cue type and reason): pt educated in two cup method to assist with maintaining optimal body mechanics             Lower Body Dressing: Supervision/safety;Sitting/lateral leans Lower Body Dressing Details (indicate cue type and reason): pt able to perform safe demonstration of donning bil LE socks while sitting EOB Toilet Transfer: Supervision/safety;Ambulation;Comfort height toilet         Tub/Shower Transfer Details (indicate cue type and reason): pt educated in using shower seat to assist with tub transfer safety Functional mobility during ADLs: Supervision/safety General ADL Comments: Pt educated in adaptive equipment options to assist with maintaining proper body mechanics during ADLs. Pt and pt's wife educated in donning and doffing cervical brace     Vision     Perception     Praxis      Pertinent Vitals/Pain Pain Assessment: 0-10 Pain Score: 4  Pain Location: neck Pain Descriptors / Indicators: Aching;Operative site guarding Pain Intervention(s): Monitored during session     Hand Dominance Right   Extremity/Trunk Assessment Upper Extremity Assessment Upper Extremity Assessment: Defer to OT evaluation   Lower Extremity Assessment Lower Extremity Assessment: Overall [facility]  for tasks assessed   Cervical / Trunk Assessment Cervical  / Trunk Assessment: Other exceptions Cervical / Trunk Exceptions: cervical brace   Communication Communication Communication: No difficulties   Cognition Arousal/Alertness: Awake/alert Behavior During Therapy: WFL for tasks assessed/performed Overall Cognitive Status: Within Functional Limits for tasks assessed                     General Comments   Pt's wife present during session. Pt reports no numbness or tingling in bil UE.    Exercises       Shoulder Instructions      Home Living Family/patient expects to be discharged to:: Private residence Living Arrangements: Spouse/significant other Available Help at Discharge: Family;Available 24 hours/day;Available PRN/intermittently Type of Home: House Home Access: Stairs to enter Enter[employer] CorporationEntrance Stairs-Number of Steps: 2-3 garage entry; 4-5 front entrance Entrance Stairs-Rails: None Home Layout: Multi-level;Able to live on main level with bedroom/bathroom Alternate Level Stairs-Number of Steps: flight Alternate Level Stairs-Rails: Right Bathroom Shower/Tub: Tub/shower unit   Bathroom Toilet: Handicapped height     Home Equipment: Hand held shower head;Shower seat   Additional Comments: Pt reports he has grabs but hasnt installed them and he has access to walker and w/c if he needs it      Prior Functioning/Environment Level of Independence: Independent             OT Diagnosis: Generalized weakness;Acute pain   OT Problem List: Decreased strength;Decreased range of motion;Decreased knowledge of precautions;Pain   OT Treatment/Interventions:      OT Goals(Current goals can be found in the care plan section) Acute Rehab OT Goals Patient Stated Goal: to go home  OT Frequency:     Barriers to D/C:            Co-evaluation              End of Session Equipment Utilized During Treatment: Gait belt  Activity Tolerance: Patient tolerated treatment well Patient left: in bed;with call bell/phone within  reach;with family/visitor present (sitting EOB)   Time: [id number] OT Time Calculation (min): 26 min Charges:  OT General Charges $OT Visit: 1 Procedure OT Evaluation $Initial OT Evaluation Tier I: 1 Procedure OT Treatments $Self Care/Home Management : 8-22 mins G-Codes:    Smiley HousemanJames Landon Yanessa Hocevar 11/26/2014, 9:48 AM

## 2014-11-26 NOTE — Progress Notes (Signed)
Subjective: Patient reports "I still feel stronger. The medicine wore off overnight, but just sore in the back of my neck"  Objective: Vital signs in last 24 hours: Temp:  [97.6 F (36.4 C)-98.4 F (36.9 C)] 98.2 F (36.8 C) (11/08 0400) Pulse Rate:  [63-76] 72 (11/08 0400) Resp:  [12-30] 20 (11/08 0400) BP: (135-172)/(64-92) 138/76 mmHg (11/08 0400) SpO2:  [86 %-100 %] 100 % (11/08 0400) Weight:  [92.987 kg (205 lb)] 92.987 kg (205 lb) (11/07 1114)  Intake/Output from previous day: 11/07 0701 - 11/08 0700 In: 1490 [P.O.:240; I.V.:1250] Out: -  Intake/Output this shift:    Alert, conversant. Strength is full to confrontational testing all extremities. Pt reports numbness is now only intermittent left arm. Hand intrinsics full. incision flat, without erythema, or drainage. Mild throat soreness, but no dysphagia.   Lab Results:  Recent Labs  11/25/14 1132  WBC 13.8*  HGB 16.0  HCT 45.1  PLT 285   BMET  Recent Labs  11/25/14 1132  NA 139  K 3.9  CL 101  CO2 28  GLUCOSE 112*  BUN 19  CREATININE 0.71  CALCIUM 9.3    Studies/Results: Dg Cervical Spine 2-3 Views  11/25/2014  CLINICAL DATA:  Intraoperative imaging, anterior cervical discectomy and fusion. EXAM: CERVICAL SPINE - 2-3 VIEW COMPARISON:  None available FINDINGS: Intraoperative cross-table lateral views demonstrate initial localizer needle projecting at C4-5. Second film demonstrates anterior cervical discectomy and fusion spanning C3-C6. Normal alignment. Surgical sponge noted anterior to the hardware. IMPRESSION: No complicating feature status post anterior cervical discectomy and fusion spanning C3-C6. Electronically Signed   By: Judie PetitM.  Shick M.D.   On: 11/25/2014 17:02    Assessment/Plan: Improving   LOS: 1 day  Per DrStern, will try Dilaudid 2mg  1 po q6hrs prn pain since Percocet causes significant anxiety and Norco helped little. Robaxin has been helpful and will continue at home. Pt verbalizes  understanding of d/c instructions and will f/u in office in 3-4 weeks. Continue Vista collar. D/C IV, d/c to home. Rx's Dilaudid and Robaxin to chart.    Georgiann Cockeroteat, Bastien Strawser 11/26/2014, 7:44 AM

## 2014-11-26 NOTE — Care Management (Signed)
Utilization review completed. Siddarth Hsiung, RN Case Manager [phone]. 

## 2014-11-26 NOTE — Evaluation (Signed)
Physical Therapy Evaluation and discharge Patient Details Name: Jordan Freeman MRN: [id number] DOB: 1955/03/08 Today's Date: 11/26/2014   History of Present Illness  59 y.o. male s/p anterior cervical discetomy and fusion with PEEK cages, allograft and plating cervical 3-4,4-5,5-6 n/a with autograft, allograft, anterior cervical plate. PMH: high cholesterol, HTN  Clinical Impression  Pt demonstrating good safety with mobility with ambulation and stairs.  Educated on slowing transfers down to avoid risk of increase in dizziness.  Reviewed precautions and use of collar.  Pt will have wife assisting at home as needed.  Will d/c from PT at this time.     Follow Up Recommendations No PT follow up    Equipment Recommendations  None recommended by PT    Recommendations for Other Services       Precautions / Restrictions Precautions Precautions: Fall;Cervical Precaution Comments: reviwed body mechanics and positioning Required Braces or Orthoses: Cervical Brace Cervical Brace: Hard collar Restrictions Weight Bearing Restrictions: No      Mobility  Bed Mobility Overal bed mobility: Modified Independent Bed Mobility: Rolling;Sidelying to Sit Rolling: Supervision Sidelying to sit: Supervision       General bed mobility comments: cues to slow down to decrease chance of dizziness  Transfers Overall transfer level: Modified independent Equipment used: None Transfers: Sit to/from Stand Sit to Stand: Supervision         General transfer comment: cues to maintain proper body mechanics  Ambulation/Gait Ambulation/Gait assistance: Modified independent (Device/Increase time) Ambulation Distance (Feet): 400 Feet Assistive device: None Gait Pattern/deviations: Step-through pattern     General Gait Details: no LOB with good cadence and speed  Stairs Stairs: Yes Stairs assistance: Supervision Stair Management: One rail Left;No rails;Alternating pattern;Forwards Number of  Stairs: 8 (x2) General stair comments: Practiced on 3 steps without rail (for entering home) and used rail for remaining steps (for going upstairs in home)  Wheelchair Mobility    Modified Rankin (Stroke Patients Only)       Balance Overall balance assessment: No apparent balance deficits (not formally assessed) Sitting-balance support: No upper extremity supported;Feet supported Sitting balance-Leahy Scale: Good     Standing balance support: No upper extremity supported;During functional activity Standing balance-Leahy Scale: Fair Standing balance comment: pt able to stand at sink to perform oral care with no UE support                             Pertinent Vitals/Pain Pain Assessment: 0-10 Pain Score: 4  Pain Location: neck Pain Descriptors / Indicators: Aching;Operative site guarding Pain Intervention(s): Monitored during session    Home Living Family/patient expects to be discharged to:: Private residence Living Arrangements: Spouse/significant other Available Help at Discharge: Family;Available 24 hours/day;Available PRN/intermittently Type of Home: House Home Access: Stairs to enter Entrance Stairs-Rails: None Entrance Stairs-Number of Steps: 2-3 garage entry; 4-5 front entrance Home Layout: Multi-level;Able to live on main level with bedroom/bathroom Home Equipment: Hand held shower head;Shower seat Additional Comments: Pt reports he has grabs but hasnt installed them and he has access to walker and w/c if he needs it    Prior Function Level of Independence: Independent               Hand Dominance   Dominant Hand: Right    Extremity/Trunk Assessment   Upper Extremity Assessment: Defer to OT evaluation           Lower Extremity Assessment: Overall WFL for tasks assessed  Cervical / Trunk Assessment: Other exceptions  Communication   Communication: No difficulties  Cognition Arousal/Alertness: Awake/alert Behavior During  Therapy: WFL for tasks assessed/performed Overall Cognitive Status: Within Functional Limits for tasks assessed                      General Comments General comments (skin integrity, edema, etc.): denies numbness and tingling in any extremities    Exercises        Assessment/Plan    PT Assessment Patent does not need any further PT services  PT Diagnosis Difficulty walking   PT Problem List    PT Treatment Interventions     PT Goals (Current goals can be found in the Care Plan section) Acute Rehab PT Goals Patient Stated Goal: to go home PT Goal Formulation: All assessment and education complete, DC therapy    Frequency     Barriers to discharge        Co-evaluation               End of Session   Activity Tolerance: Patient tolerated treatment well Patient left: in bed;with family/visitor present Nurse Communication: Mobility status         Time: [id number] PT Time Calculation (min) (ACUTE ONLY): 12 min   Charges:   PT Evaluation $Initial PT Evaluation Tier I: 1 Procedure     PT G Codes:        Jordan Freeman 11/26/2014, 9:32 AM

## 2014-11-26 NOTE — Discharge Summary (Signed)
Physician Discharge Summary  Patient ID: Jordan Freeman MRN: [id number] DOB/AGE: 05/13/1955 59 y.o.  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  Following uncomplicated ACDF C3-6 levels, he recovered nicely and transferred to [facility] for nursing observation. He has progressed nicely.   Consults: None  Significant Diagnostic Studies: radiology: X-Ray: intra-operative  Treatments: surgery: Anterior Cervical Discectomy and Fusion with PEEK Cages, Allograft and Plating Cervical Three-Four/Four-Five/Five-Six  (N/A) with autograft, allograft, anterior cervical plate   Discharge Exam: Blood pressure 138/76, pulse 72, temperature 98.2 F (36.8 C), temperature source Oral, resp. rate 20, height  (1.727 m), weight 92.987 kg (205 lb), SpO2 100 %. Alert, conversant. Strength is full to confrontational testing all extremities. Pt reports numbness is now only intermittent left arm. Hand intrinsics full. incision flat, without erythema, or drainage. Mild throat soreness, but no dysphagia.    Disposition: Discharge to home.  Pt verbalizes understanding of d/c instructions and will f/u in office in 3-4 weeks. Continue Vista collar. D/C IV, d/c to home. Rx's Dilaudid and Robaxin to chart.       Medication List    ASK your doctor about these medications        aspirin 81 MG tablet  Take 81 mg by mouth daily.      cetirizine 10 MG tablet  Commonly known as:  ZYRTEC  Take 10 mg by mouth daily as needed for allergies.     DULoxetine 60 MG capsule  Commonly known as:  CYMBALTA  Take 60 mg by mouth daily.     HYDROcodone-acetaminophen 5-325 MG tablet  Commonly known as:  NORCO/VICODIN  Take 1 tablet by mouth 3 (three) times daily as needed.     lisinopril-hydrochlorothiazide 20-12.5 MG tablet  Commonly known as:  PRINZIDE,ZESTORETIC  Take 2 tablets by mouth daily.     LIVALO 2 MG Tabs  Generic drug:  Pitavastatin Calcium  Take 1 tablet by mouth daily.     multivitamin with minerals tablet  Take 1 tablet by mouth daily.     omega-3 acid ethyl esters 1 G capsule  Commonly known as:  LOVAZA  Take 2 g by mouth 2 (two) times daily.     omeprazole 20 MG capsule  Commonly known as:  PRILOSEC  Take 20 mg by mouth daily.     predniSONE 10 MG tablet  Commonly known as:  DELTASONE  Take 10 mg by mouth daily with breakfast. Pt. States took a 6 day dose pack  Ask about: Should I take this medication?     psyllium 58.6 % powder  Commonly known as:  METAMUCIL  Take 1 packet by mouth 2 (two) times daily.     temazepam 30 MG capsule  Commonly known as:  RESTORIL  Take 30 mg by mouth at bedtime as needed for sleep.     VIAGRA 100 MG tablet  Generic drug:  sildenafil  Take 100 mg by mouth daily as needed.  Follow-up Information    Follow up with Dorian HeckleSTERN,JOSEPH D, MD.   Specialty:  Neurosurgery   Contact information:   [address] [place] [address] [phone]       Signed: Georgiann Cockeroteat, Jakori Burkett 11/26/2014, 7:53 AM

## 2016-04-01 ENCOUNTER — Other Ambulatory Visit: Payer: Self-pay | Admitting: Neurosurgery

## 2016-04-01 DIAGNOSIS — M48061 Spinal stenosis, lumbar region without neurogenic claudication: Secondary | ICD-10-CM

## 2016-06-07 ENCOUNTER — Other Ambulatory Visit: Payer: 59

## 2016-07-28 IMAGING — CR DG CERVICAL SPINE 2 OR 3 VIEWS
1 series · 1 of 1 positions shown · non-contrast
Comparison: None available

CLINICAL DATA: Intraoperative imaging, anterior cervical discectomy
and fusion.

EXAM:
CERVICAL SPINE - 2-3 VIEW

[AP]
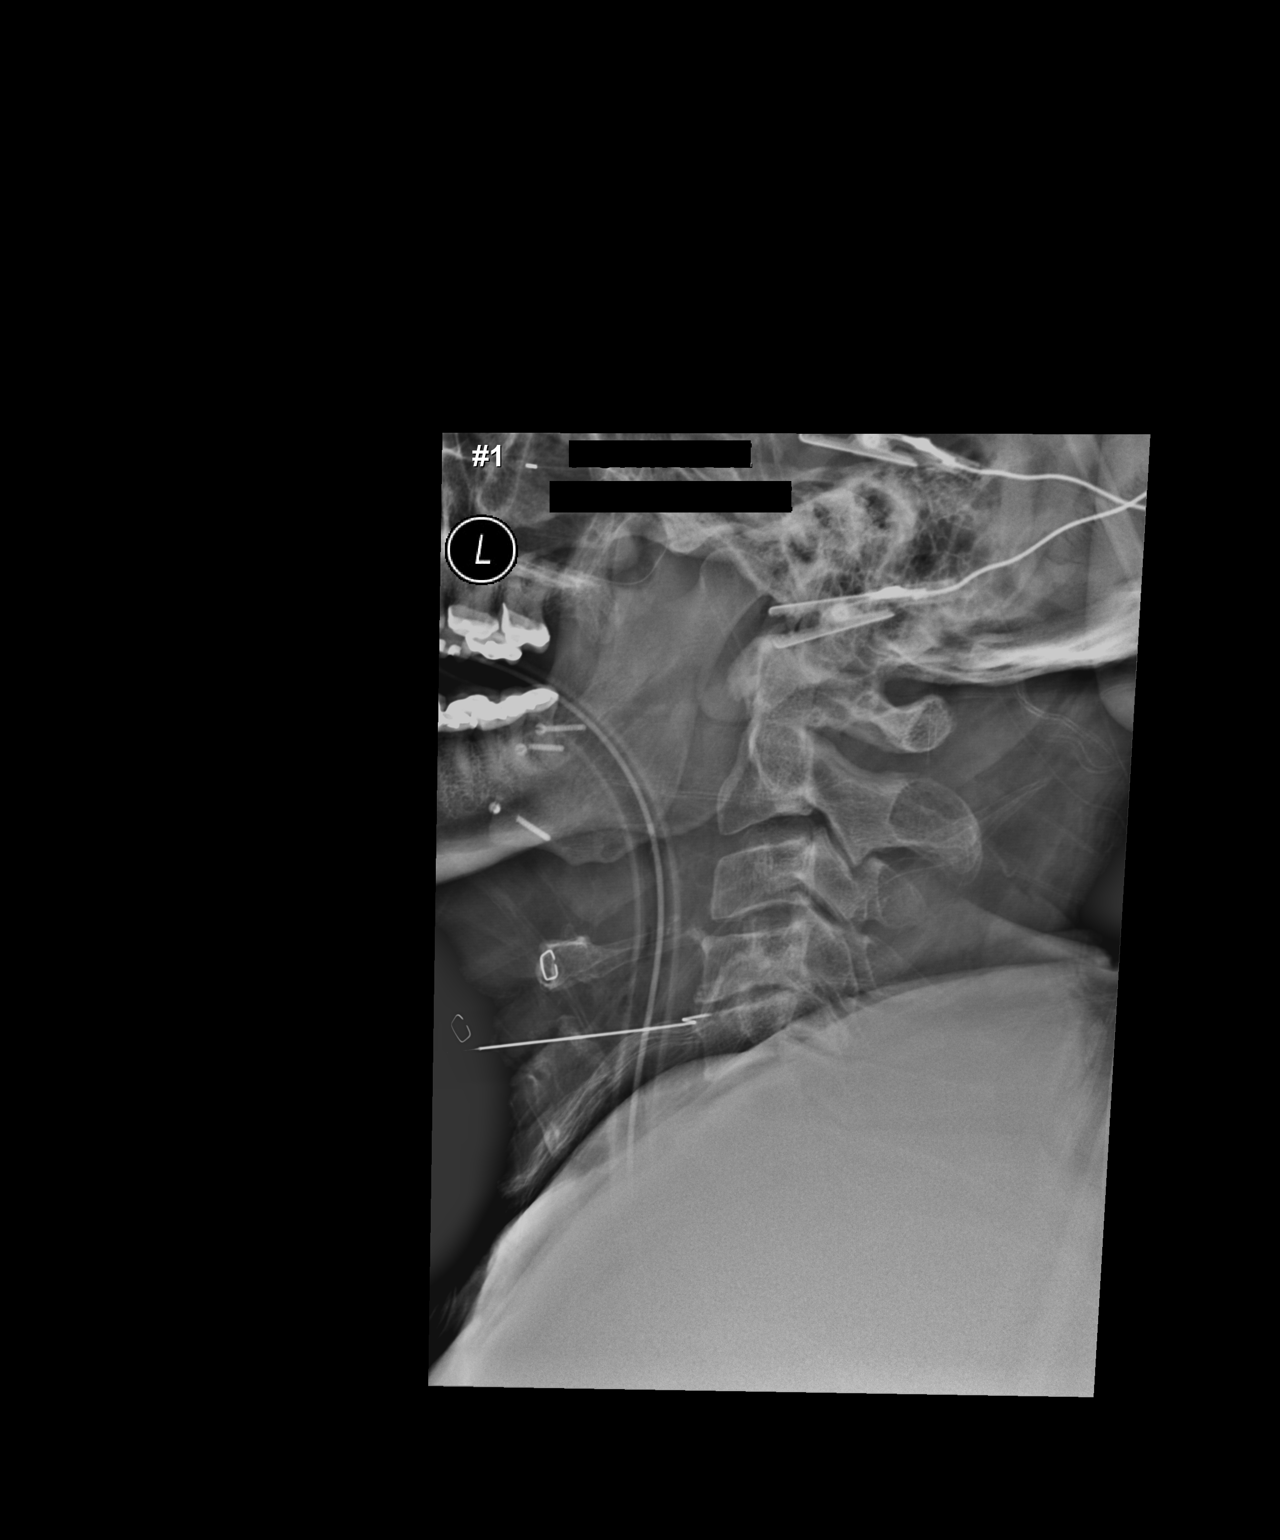

[1 of 1 positions shown; findings below may reference images not displayed]

FINDINGS: Intraoperative cross-table lateral views demonstrate initial
localizer needle projecting at C4-5.

Second film demonstrates anterior cervical discectomy and fusion
spanning C3-C6. Normal alignment. Surgical sponge noted anterior to
the hardware.
IMPRESSION: No complicating feature status post anterior cervical discectomy and
fusion spanning C3-C6.
